# Patient Record
Sex: Male | Born: 1970 | Race: White | Hispanic: No | Marital: Married | Smoking: Former smoker
Health system: Southern US, Community
[De-identification: ages and names within clinical notes are randomized; demographics above are authoritative.]

## PROBLEM LIST (undated history)

## (undated) DIAGNOSIS — I10 Essential (primary) hypertension: Secondary | ICD-10-CM

## (undated) DIAGNOSIS — N289 Disorder of kidney and ureter, unspecified: Secondary | ICD-10-CM

---

## 2017-06-23 ENCOUNTER — Emergency Department (HOSPITAL_COMMUNITY): Payer: Worker's Compensation

## 2017-06-23 ENCOUNTER — Encounter (HOSPITAL_COMMUNITY): Payer: Self-pay | Admitting: Emergency Medicine

## 2017-06-23 ENCOUNTER — Emergency Department (HOSPITAL_COMMUNITY)
Admission: EM | Admit: 2017-06-23 | Discharge: 2017-06-23 | Disposition: A | Discharge status: Home / Self Care | Payer: Worker's Compensation | Attending: Physician Assistant | Admitting: Physician Assistant

## 2017-06-23 DIAGNOSIS — W268XXA Contact with other sharp object(s), not elsewhere classified, initial encounter: Secondary | ICD-10-CM | POA: Insufficient documentation

## 2017-06-23 DIAGNOSIS — Y929 Unspecified place or not applicable: Secondary | ICD-10-CM | POA: Diagnosis not present

## 2017-06-23 DIAGNOSIS — Y998 Other external cause status: Secondary | ICD-10-CM | POA: Insufficient documentation

## 2017-06-23 DIAGNOSIS — S81819A Laceration without foreign body, unspecified lower leg, initial encounter: Secondary | ICD-10-CM | POA: Diagnosis present

## 2017-06-23 DIAGNOSIS — I10 Essential (primary) hypertension: Secondary | ICD-10-CM | POA: Insufficient documentation

## 2017-06-23 DIAGNOSIS — Y9301 Activity, walking, marching and hiking: Secondary | ICD-10-CM | POA: Diagnosis not present

## 2017-06-23 DIAGNOSIS — Z23 Encounter for immunization: Secondary | ICD-10-CM | POA: Insufficient documentation

## 2017-06-23 DIAGNOSIS — Z87891 Personal history of nicotine dependence: Secondary | ICD-10-CM | POA: Diagnosis not present

## 2017-06-23 HISTORY — DX: Essential (primary) hypertension: I10

## 2017-06-23 HISTORY — DX: Disorder of kidney and ureter, unspecified: N28.9

## 2017-06-23 MED ORDER — CEPHALEXIN 250 MG PO CAPS
500.0000 mg | ORAL_CAPSULE | Freq: Once | ORAL | Status: AC
  Administered 2017-06-23: 500 mg via ORAL
  Filled 2017-06-23: qty 2

## 2017-06-23 MED ORDER — LIDOCAINE-EPINEPHRINE (PF) 2 %-1:200000 IJ SOLN
20.0000 mL | Freq: Once | INTRAMUSCULAR | Status: AC
  Administered 2017-06-23: 20 mL
  Filled 2017-06-23: qty 20

## 2017-06-23 MED ORDER — CEPHALEXIN 500 MG PO CAPS: 500.0000 mg | ORAL_CAPSULE | Freq: Two times a day (BID) | ORAL | 0 refills | Status: AC

## 2017-06-23 MED ORDER — TETANUS-DIPHTH-ACELL PERTUSSIS 5-2.5-18.5 LF-MCG/0.5 IM SUSP
0.5000 mL | Freq: Once | INTRAMUSCULAR | Status: AC
  Administered 2017-06-23: 0.5 mL via INTRAMUSCULAR
  Filled 2017-06-23: qty 0.5

## 2017-06-23 NOTE — ED Provider Notes (Signed)
[facility]-EMERGENCY DEPT Provider Note   CSN: [id number] Arrival date & time: 06/23/17  1436     History   Chief Complaint Chief Complaint  Patient presents with  . Extremity Laceration    HPI Alan Clarke is a 46 y.o. male.  HPI   Patient presents with laceration of the right lower extremity that occurred just prior to arrival.  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   He denies weakness or numbness of his foot or ankle.  Denies other injury.  Unsure of his last tetanus vx.    Past Medical History:  Diagnosis Date  . Hypertension   . Renal disorder    KIDNEY STONES    There are no active problems to display for this patient.   History reviewed. No pertinent surgical history.     Home Medications    Prior to Admission medications   Not on File    Family History No family history on file.  Social History Social History  Substance Use Topics  . Smoking status: Former Games developermoker  . Smokeless tobacco: Never Used  . Alcohol use Yes     Comment: "OCCASS"     Allergies   Codeine   Review of Systems Review of Systems  Constitutional: Negative for chills and fever.  Cardiovascular: Negative for leg swelling.  Musculoskeletal: Negative for arthralgias and myalgias.  Skin: Positive for wound. Negative for color change and pallor.  Allergic/Immunologic: Negative for immunocompromised state.  Neurological: Negative for weakness and numbness.  Psychiatric/Behavioral: Negative for self-injury.     Physical Exam Updated Vital Signs BP (!) 155/87 (BP Location: Left Arm)   Pulse (!) 102   Temp 97.9 F (36.6 C) (Oral)   Resp 18   Ht 5\' 9"  (1.753 m)   Wt 99.8 kg (220 lb)   SpO2 93%   BMI 32.49 kg/m   Physical Exam  Constitutional: He appears well-developed and well-nourished. No distress.  HENT:  Head: Normocephalic and atraumatic.  Neck: Neck supple.  Pulmonary/Chest:  Effort normal.  Musculoskeletal:  Right anterior lower leg with vertical laceration.  Hemostatic.  Muscle tissue exposed.  5/5 strength with flexion and extension of ankle.  Distal pulses and sensation intact.    Neurological: He is alert.  Skin: He is not diaphoretic.  Nursing note and vitals reviewed.      ED Treatments / Results  Labs (all labs ordered are listed, but only abnormal results are displayed) Labs Reviewed - No data to display  EKG  EKG Interpretation None       Radiology No results found.  Procedures Procedures (including critical care time)  Medications Ordered in ED Medications  Tdap (BOOSTRIX) injection 0.5 mL (0.5 mLs Intramuscular Given 06/23/17 1631)  lidocaine-EPINEPHrine (XYLOCAINE W/EPI) 2 %-1:200000 (PF) injection 20 mL (20 mLs Infiltration Given by Other 06/23/17 1632)     Initial Impression / Assessment and Plan / ED Course  I have reviewed the triage vital signs and the nursing notes.  Pertinent labs & imaging results that were available during my care of the patient were reviewed by me and considered in my medical decision making (see chart for details).     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  Tetanus updated.   Xray pending at change of shift.   Patient signed out to [facility] Murray PA-C and Burna FortsJeff Hedges, PA-C at change of  shift pending Xray results and wound repair.    Final Clinical Impressions(s) / ED Diagnoses   Final diagnoses:  Laceration of lower leg    New Prescriptions New Prescriptions   No medications on file     Trixie Dredge, Cordelia Poche 06/23/17 1740    Tilden Fossa, MD 06/24/17 1212

## 2017-06-23 NOTE — ED Notes (Signed)
EDP at bedside, suturing patient's wound.  

## 2017-06-23 NOTE — ED Provider Notes (Signed)
..  Laceration Repair Date/Time: 06/23/2017 8:49 PM Performed by: Elisha PonderMURRAY, Tajae Rybicki B Authorized by: Aviva KluverMURRAY, Amara Justen B   Consent:    Consent obtained:  Verbal   Consent given by:  Patient   Risks discussed:  Infection, pain and need for additional repair Anesthesia (see MAR for exact dosages):    Anesthesia method:  Local infiltration   Local anesthetic:  Lidocaine 2% WITH epi Laceration details:    Location:  Leg   Leg location:  R lower leg   Length (cm):  6   Depth (mm):  1.5 (At greatest depth) Repair type:    Repair type:  Complex (Fascial closure followed by 2-layer closure) Pre-procedure details:    Preparation:  Patient was prepped and draped in usual sterile fashion and imaging obtained to evaluate for foreign bodies Exploration:    Limited defect created (wound extended): no     Hemostasis achieved with:  Direct pressure   Wound exploration: wound explored through full range of motion     Wound extent: fascia violated     Contaminated: no   Treatment:    Area cleansed with:  Betadine   Amount of cleaning:  Standard   Irrigation solution:  Sterile saline   Irrigation volume:  750 ml   Irrigation method:  Pressure wash and syringe   Visualized foreign bodies/material removed: no     Debridement:  None   Undermining:  None Skin repair:    Repair method:  Sutures   Suture size: 3-0 for fascial closure, 4-0 for subcutaneous and superficial.    Wound skin closure material used: Vicryl for deep layers, nylon for superficial layers.   Suture technique:  Vertical mattress (Simple buried subcutaneous.)   Number of sutures: 29 total across 3 layers. Approximation:    Approximation:  Close   Vermilion border: well-aligned   Post-procedure details:    Dressing:  Non-adherent dressing   Patient tolerance of procedure:  Tolerated well, no immediate complications     Delia ChimesMurray, Jantz Main B, PA-C 06/23/17 2057    Abelino DerrickMackuen, Courteney Lyn, MD 06/23/17 [phone]

## 2017-06-23 NOTE — ED Triage Notes (Signed)
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  Bleeding controlled at this time. Neuro intact, skin warm and dry.

## 2017-06-23 NOTE — ED Notes (Addendum)
Patient verbalized understanding of discharge instructions and denies any further needs or questions at this time. VS stable. Patient ambulatory with steady gait, assisted to ED entrance in wheelchair.  

## 2017-06-23 NOTE — Discharge Instructions (Signed)
Please read attached information. If you experience any new or worsening signs or symptoms please return to the emergency room for evaluation. Please follow-up with your primary care provider or specialist as discussed. Please use medication prescribed only as directed and discontinue taking if you have any concerning signs or symptoms.   °

## 2018-02-21 IMAGING — DX DG TIBIA/FIBULA 2V*R*
4 series · 4 of 4 positions shown · non-contrast
Comparison: None.

CLINICAL DATA: Laceration of lower leg.

EXAM:
RIGHT TIBIA AND FIBULA - 2 VIEW

[x tib-fib ap right (1 of 2)]
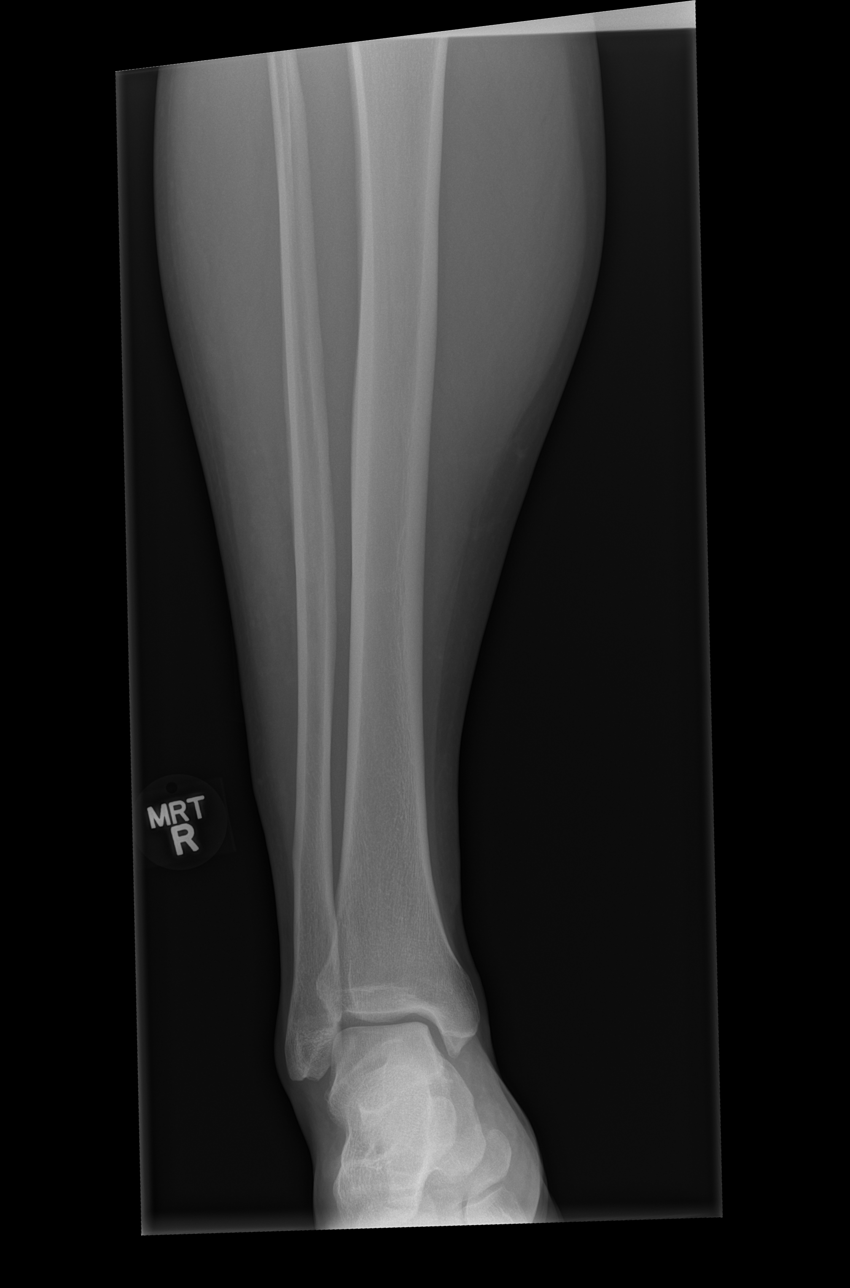

[x tib-fib ap right (2 of 2)]
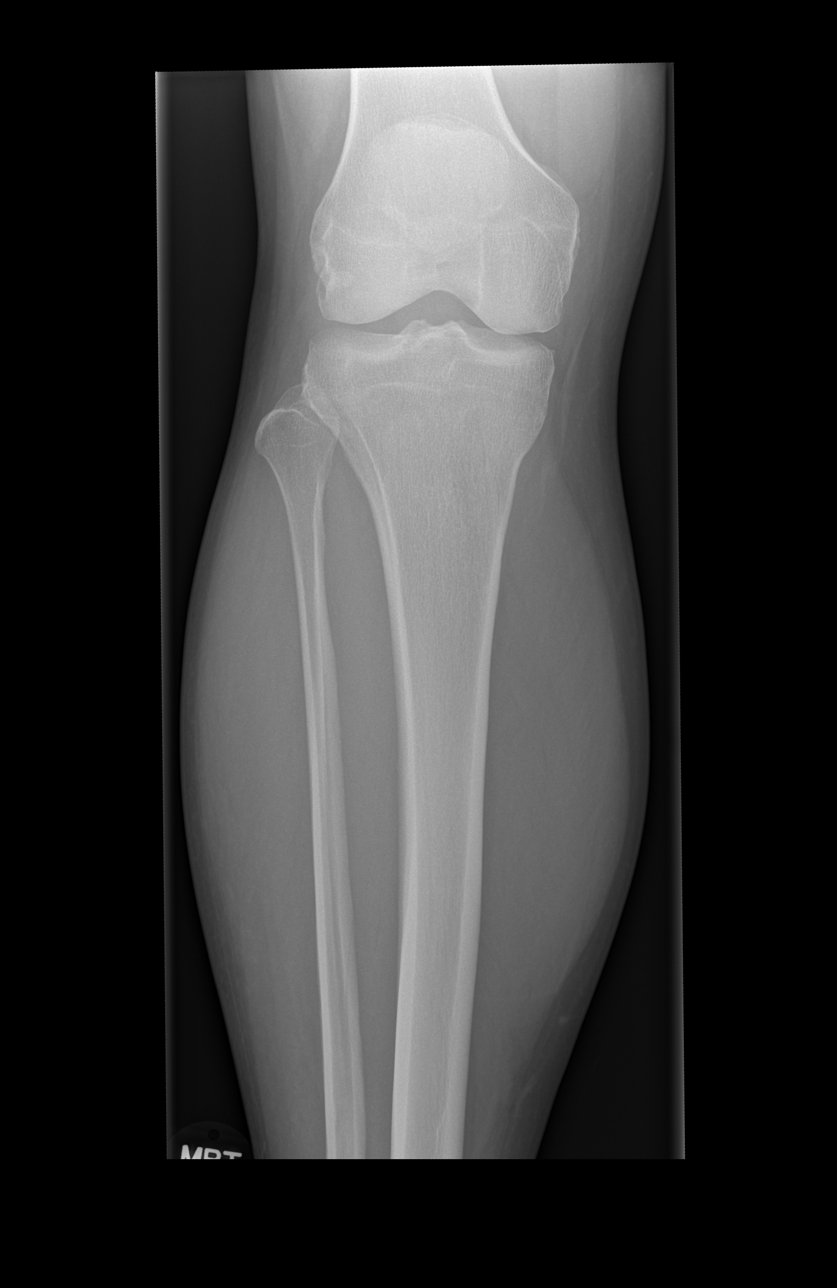

[x tib-fib lat right (1 of 2)]
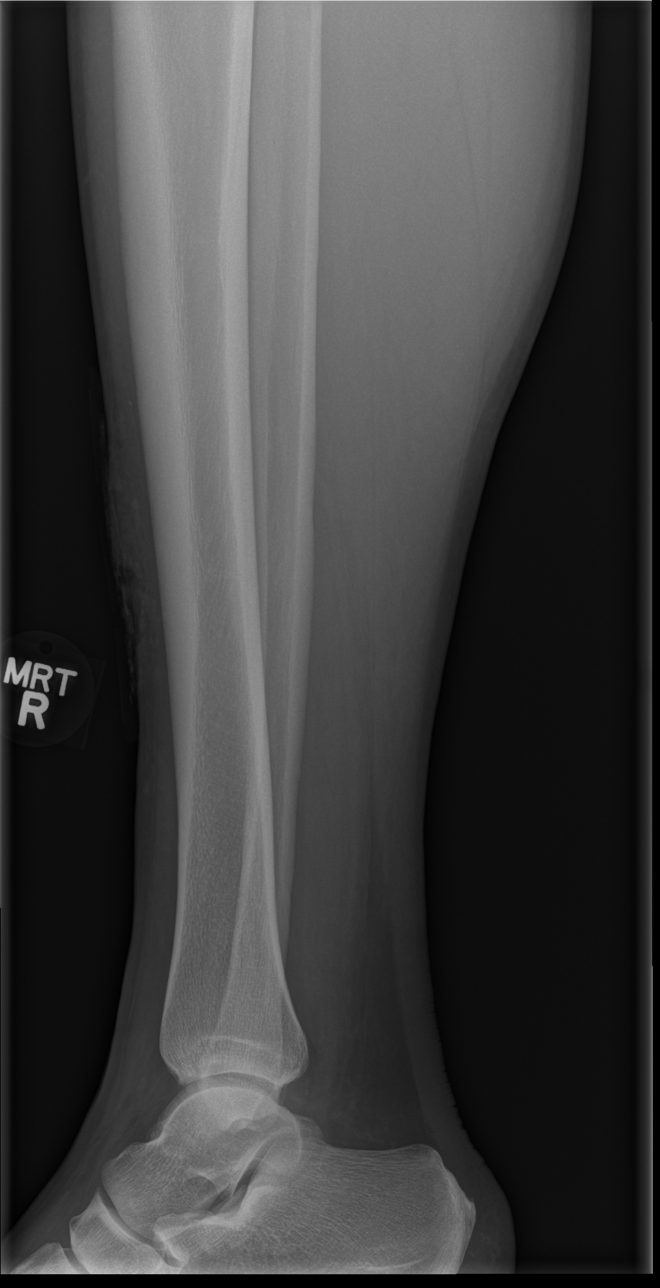

[x tib-fib lat right (2 of 2)]
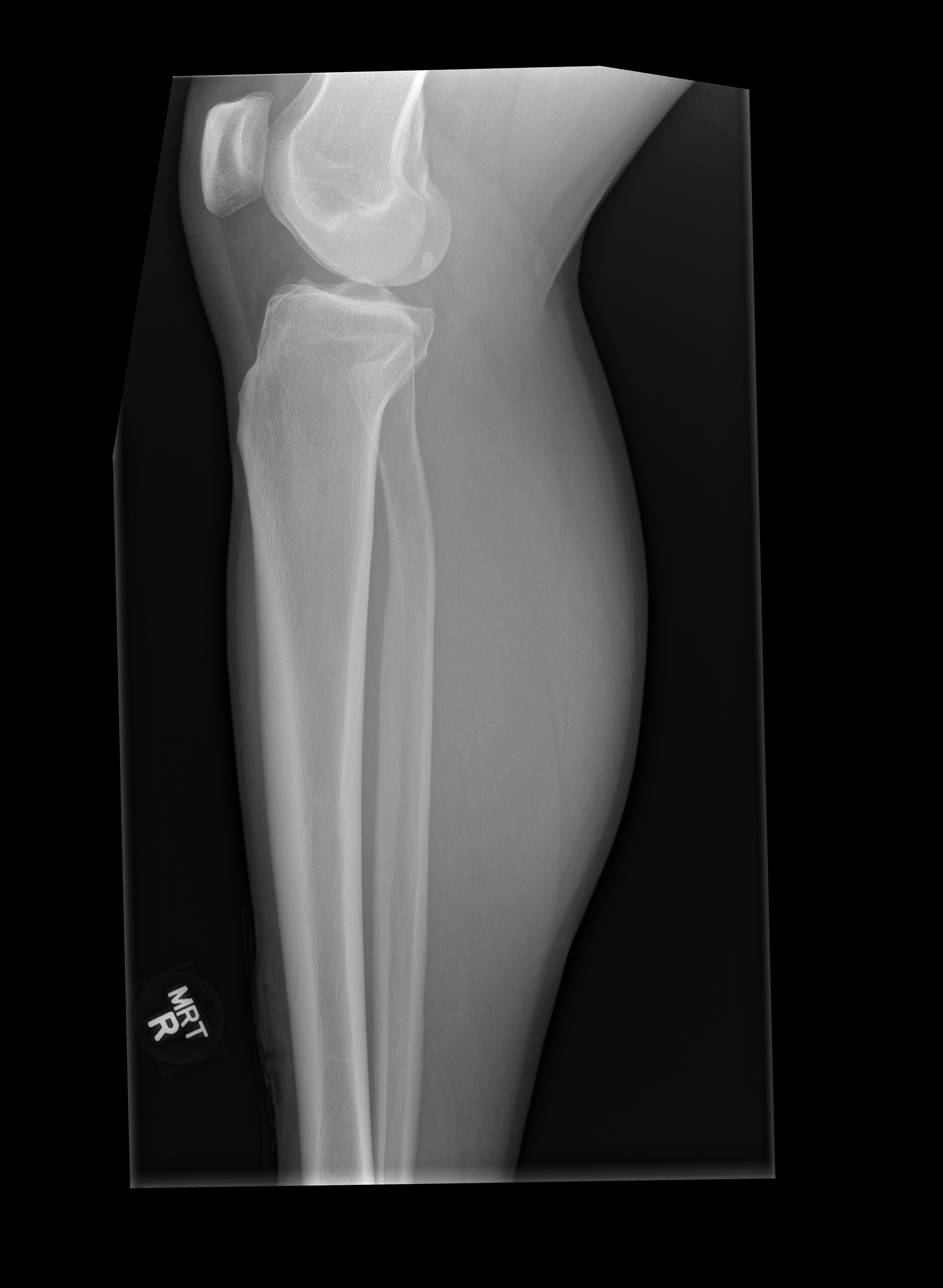

[4 of 4 positions shown; findings below may reference images not displayed]

FINDINGS: There is no evidence of fracture or other focal bone lesions.
Laceration is seen involving the soft tissues anterior to the distal
tibia.
IMPRESSION: Soft tissue laceration as described above. The right tibia and
fibula appear normal.
